# Patient Record
Sex: Male | Born: 1995 | Race: White | Hispanic: No | Marital: Single | Smoking: Never smoker
Health system: Southern US, Community
[De-identification: ages and names within clinical notes are randomized; demographics above are authoritative.]

---
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
## 2015-02-10 ENCOUNTER — Emergency Department (HOSPITAL_COMMUNITY)
Admission: EM | Admit: 2015-02-10 | Discharge: 2015-02-10 | Disposition: A | Discharge status: Home / Self Care | Payer: Self-pay | Attending: Emergency Medicine | Admitting: Emergency Medicine

## 2015-02-10 ENCOUNTER — Emergency Department (HOSPITAL_COMMUNITY): Payer: Self-pay

## 2015-02-10 ENCOUNTER — Encounter (HOSPITAL_COMMUNITY): Payer: Self-pay | Admitting: Emergency Medicine

## 2015-02-10 DIAGNOSIS — R079 Chest pain, unspecified: Secondary | ICD-10-CM | POA: Insufficient documentation

## 2015-02-10 DIAGNOSIS — R0602 Shortness of breath: Secondary | ICD-10-CM | POA: Insufficient documentation

## 2015-02-10 LAB — TROPONIN I

## 2015-02-10 MED ORDER — IBUPROFEN 600 MG PO TABS: 600.0000 mg | ORAL_TABLET | Freq: Four times a day (QID) | ORAL | Status: AC | PRN

## 2015-02-10 NOTE — ED Notes (Signed)
Pt. reports intermittent left chest pain with mild SOB onset this evening , denies cough , no nausea or diaphoresis .

## 2015-02-10 NOTE — ED Notes (Signed)
Patient transported to X-ray 

## 2015-02-10 NOTE — ED Provider Notes (Signed)
CSN: [id number]     Arrival date & time 02/10/15  0050 History   First MD Initiated Contact with Patient 02/10/15 0054     Chief Complaint  Patient presents with  . Chest Pain     (Consider location/radiation/quality/duration/timing/severity/associated sxs/prior Treatment) HPI Comments: 19 year old male presents to the emergency department for evaluation of chest pain. He reports that pain began at 2300 while watching TV. He describes the pain as sharp and nonradiating. It was initially present in his lower midsternal region and migrated slightly left of his sternum. He denies any pain at this time. His pain was initially worse with deep breathing. He reports some mild shortness of breath with his symptoms. No medications taken prior to arrival. He states that symptoms lasted for approximately one hour before spontaneously resolving. Patient reports a history of similar symptoms in the past. He states that it is usually present for a few minutes and then goes away. Patient eyes no recent surgeries, hospitalizations, or travel. He is a social smoker and does not have a history of hypertension or diabetes. No family history of sudden cardiac death. No associated fever, syncope, lightheadedness, dizziness, or leg swelling. No nausea or vomiting.  Patient is a 19 y.o. male presenting with chest pain. The history is provided by the patient. No language interpreter was used.  Chest Pain Associated symptoms: shortness of breath   Associated symptoms: no dizziness, no fever, no nausea and not vomiting     History reviewed. No pertinent past medical history. History reviewed. No pertinent past surgical history. No family history on file. Social History  Substance Use Topics  . Smoking status: Never Smoker   . Smokeless tobacco: None  . Alcohol Use: No    Review of Systems  Constitutional: Negative for fever.  Respiratory: Positive for shortness of breath.   Cardiovascular: Positive for chest  pain.  Gastrointestinal: Negative for nausea and vomiting.  Neurological: Negative for dizziness, syncope and light-headedness.  All other systems reviewed and are negative.   Allergies  Review of patient's allergies indicates no known allergies.  Home Medications   Prior to Admission medications   Medication Sig Start Date End Date Taking? Authorizing Provider  ibuprofen (ADVIL,MOTRIN) 600 MG tablet Take 1 tablet (600 mg total) by mouth every 6 (six) hours as needed. 02/10/15   Antony MaduraKelly Samiksha Pellicano, PA-C   BP 112/74 mmHg  Pulse 71  Temp(Src) 97.4 F (36.3 C) (Oral)  Resp 11  SpO2 97%   Physical Exam  Constitutional: He is oriented to person, place, and time. He appears well-developed and well-nourished. No distress.  Nontoxic/nonseptic appearing  HENT:  Head: Normocephalic and atraumatic.  Eyes: Conjunctivae and EOM are normal. No scleral icterus.  Neck: Normal range of motion.  Cardiovascular: Normal rate, regular rhythm and intact distal pulses.   Pulmonary/Chest: Effort normal and breath sounds normal. No respiratory distress. He has no wheezes. He has no rales.  Abdominal: Soft. He exhibits no distension. There is no tenderness. There is no rebound and no guarding.  Soft, nontender  Musculoskeletal: Normal range of motion.  Neurological: He is alert and oriented to person, place, and time. He exhibits normal muscle tone. Coordination normal.  GCS 15. Speech is goal oriented. Patient moving all extremities.  Skin: Skin is warm and dry. No rash noted. He is not diaphoretic. No erythema. No pallor.  Psychiatric: He has a normal mood and affect. His behavior is normal.  Nursing note and vitals reviewed.   ED Course  Procedures (  including critical care time) Labs Review Labs Reviewed  TROPONIN I    Imaging Review Dg Chest 2 View  02/10/2015  CLINICAL DATA:  Left-sided chest pain tonight EXAM: CHEST  2 VIEW COMPARISON:  None. FINDINGS: Normal heart size and mediastinal  contours. No acute infiltrate or edema. No effusion or pneumothorax. No acute osseous findings. IMPRESSION: Negative chest. Electronically Signed   By: Marnee Spring M.D.   On: 02/10/2015 01:56     I have personally reviewed and evaluated these images and lab results as part of my medical decision-making.   EKG Interpretation   Date/Time:  Tuesday February 10 2015 00:51:16 EST Ventricular Rate:  74 PR Interval:  114 QRS Duration: 92 QT Interval:  366 QTC Calculation: 406 R Axis:   45 Text Interpretation:  Normal sinus rhythm Nonspecific T wave abnormality  Abnormal ECG No old tracing to compare Confirmed by Rhunette Croft, MD, Janey Genta  [phone]) on 02/10/2015 1:47:31 AM      MDM   Final diagnoses:  Chest pain, unspecified chest pain type    19 year old male presents to the emergency department for evaluation of chest pain. He has had similar chest pain in the past which usually resolves after a few minutes. It is pleuritic in nature, worse with deep breathing. Chest x-ray negative for pleural effusion, focal consolidation, or pneumonia. No mediastinal widening to suggest aortic dissection. Patient has a reassuring cardiac workup with a negative troponin and nonischemic EKG. No family history of sudden cardiac death. Heart Score is 1 (FHx of ACS) c/w low risk of acute coronary event. Also doubt PE. Patient is PERC negative.  Patient has been chest pain-free while in the emergency department. Symptoms may be due to indigestion vs PUD vs MSK etiology. Higher suspicion for MSK such as costochondritis given pleuritic nature of pain. Do not believe there is indication for further emergent workup at this time. Will discharge with prescription for NSAIDs and instructions for primary care follow-up. Return precautions given at discharge. Patient discharged in good condition with no unaddressed concerns; VSS.   Filed Vitals:   02/10/15 0215 02/10/15 0230 02/10/15 0245 02/10/15 0300  BP: 106/53 103/60  113/71 112/74  Pulse: 67 68 70 71  Temp:      TempSrc:      Resp: SpO2: 97% 97% 97% 97%     Antony Madura, PA-C 02/10/15 0458  Derwood Kaplan, MD 02/10/15 [phone]

## 2015-02-10 NOTE — Discharge Instructions (Signed)
Nonspecific Chest Pain  °Chest pain can be caused by many different conditions. There is always a chance that your pain could be related to something serious, such as a heart attack or a blood clot in your lungs. Chest pain can also be caused by conditions that are not life-threatening. If you have chest pain, it is very important to follow up with your health care provider. °CAUSES  °Chest pain can be caused by: °· Heartburn. °· Pneumonia or bronchitis. °· Anxiety or stress. °· Inflammation around your heart (pericarditis) or lung (pleuritis or pleurisy). °· A blood clot in your lung. °· A collapsed lung (pneumothorax). It can develop suddenly on its own (spontaneous pneumothorax) or from trauma to the chest. °· Shingles infection (varicella-zoster virus). °· Heart attack. °· Damage to the bones, muscles, and cartilage that make up your chest wall. This can include: °¨ Bruised bones due to injury. °¨ Strained muscles or cartilage due to frequent or repeated coughing or overwork. °¨ Fracture to one or more ribs. °¨ Sore cartilage due to inflammation (costochondritis). °RISK FACTORS  °Risk factors for chest pain may include: °· Activities that increase your risk for trauma or injury to your chest. °· Respiratory infections or conditions that cause frequent coughing. °· Medical conditions or overeating that can cause heartburn. °· Heart disease or family history of heart disease. °· Conditions or health behaviors that increase your risk of developing a blood clot. °· Having had chicken pox (varicella zoster). °SIGNS AND SYMPTOMS °Chest pain can feel like: °· Burning or tingling on the surface of your chest or deep in your chest. °· Crushing, pressure, aching, or squeezing pain. °· Dull or sharp pain that is worse when you move, cough, or take a deep breath. °· Pain that is also felt in your back, neck, shoulder, or arm, or pain that spreads to any of these areas. °Your chest pain may come and go, or it may stay  constant. °DIAGNOSIS °Lab tests or other studies may be needed to find the cause of your pain. Your health care provider may have you take a test called an ambulatory ECG (electrocardiogram). An ECG records your heartbeat patterns at the time the test is performed. You may also have other tests, such as: °· Transthoracic echocardiogram (TTE). During echocardiography, sound waves are used to create a picture of all of the heart structures and to look at how blood flows through your heart. °· Transesophageal echocardiogram (TEE). This is a more advanced imaging test that obtains images from inside your body. It allows your health care provider to see your heart in finer detail. °· Cardiac monitoring. This allows your health care provider to monitor your heart rate and rhythm in real time. °· Holter monitor. This is a portable device that records your heartbeat and can help to diagnose abnormal heartbeats. It allows your health care provider to track your heart activity for several days, if needed. °· Stress tests. These can be done through exercise or by taking medicine that makes your heart beat more quickly. °· Blood tests. °· Imaging tests. °TREATMENT  °Your treatment depends on what is causing your chest pain. Treatment may include: °· Medicines. These may include: °¨ Acid blockers for heartburn. °¨ Anti-inflammatory medicine. °¨ Pain medicine for inflammatory conditions. °¨ Antibiotic medicine, if an infection is present. °¨ Medicines to dissolve blood clots. °¨ Medicines to treat coronary artery disease. °· Supportive care for conditions that do not require medicines. This may include: °¨ Resting. °¨ Applying heat   or cold packs to injured areas. °¨ Limiting activities until pain decreases. °HOME CARE INSTRUCTIONS °· If you were prescribed an antibiotic medicine, finish it all even if you start to feel better. °· Avoid any activities that bring on chest pain. °· Do not use any tobacco products, including  cigarettes, chewing tobacco, or electronic cigarettes. If you need help quitting, ask your health care provider. °· Do not drink alcohol. °· Take medicines only as directed by your health care provider. °· Keep all follow-up visits as directed by your health care provider. This is important. This includes any further testing if your chest pain does not go away. °· If heartburn is the cause for your chest pain, you may be told to keep your head raised (elevated) while sleeping. This reduces the chance that acid will go from your stomach into your esophagus. °· Make lifestyle changes as directed by your health care provider. These may include: °¨ Getting regular exercise. Ask your health care provider to suggest some activities that are safe for you. °¨ Eating a heart-healthy diet. A registered dietitian can help you to learn healthy eating options. °¨ Maintaining a healthy weight. °¨ Managing diabetes, if necessary. °¨ Reducing stress. °SEEK MEDICAL CARE IF: °· Your chest pain does not go away after treatment. °· You have a rash with blisters on your chest. °· You have a fever. °SEEK IMMEDIATE MEDICAL CARE IF:  °· Your chest pain is worse. °· You have an increasing cough, or you cough up blood. °· You have severe abdominal pain. °· You have severe weakness. °· You faint. °· You have chills. °· You have sudden, unexplained chest discomfort. °· You have sudden, unexplained discomfort in your arms, back, neck, or jaw. °· You have shortness of breath at any time. °· You suddenly start to sweat, or your skin gets clammy. °· You feel nauseous or you vomit. °· You suddenly feel light-headed or dizzy. °· Your heart begins to beat quickly, or it feels like it is skipping beats. °These symptoms may represent a serious problem that is an emergency. Do not wait to see if the symptoms will go away. Get medical help right away. Call your local emergency services (911 in the [place] Do not drive yourself to the hospital. °  °This  information is not intended to replace advice given to you by your health care provider. Make sure you discuss any questions you have with your health care provider. °  °Document Released: 11/17/2004 Document Revised: 02/28/2014 Document Reviewed: 09/13/2013 °[employer] Interactive Patient Education ©2016 [employer] ° °

## 2017-01-14 IMAGING — CR DG CHEST 2V
2 series · 2 of 2 positions shown · non-contrast
Comparison: None.

CLINICAL DATA: Left-sided chest pain tonight

EXAM:
CHEST  2 VIEW

[chest pa]
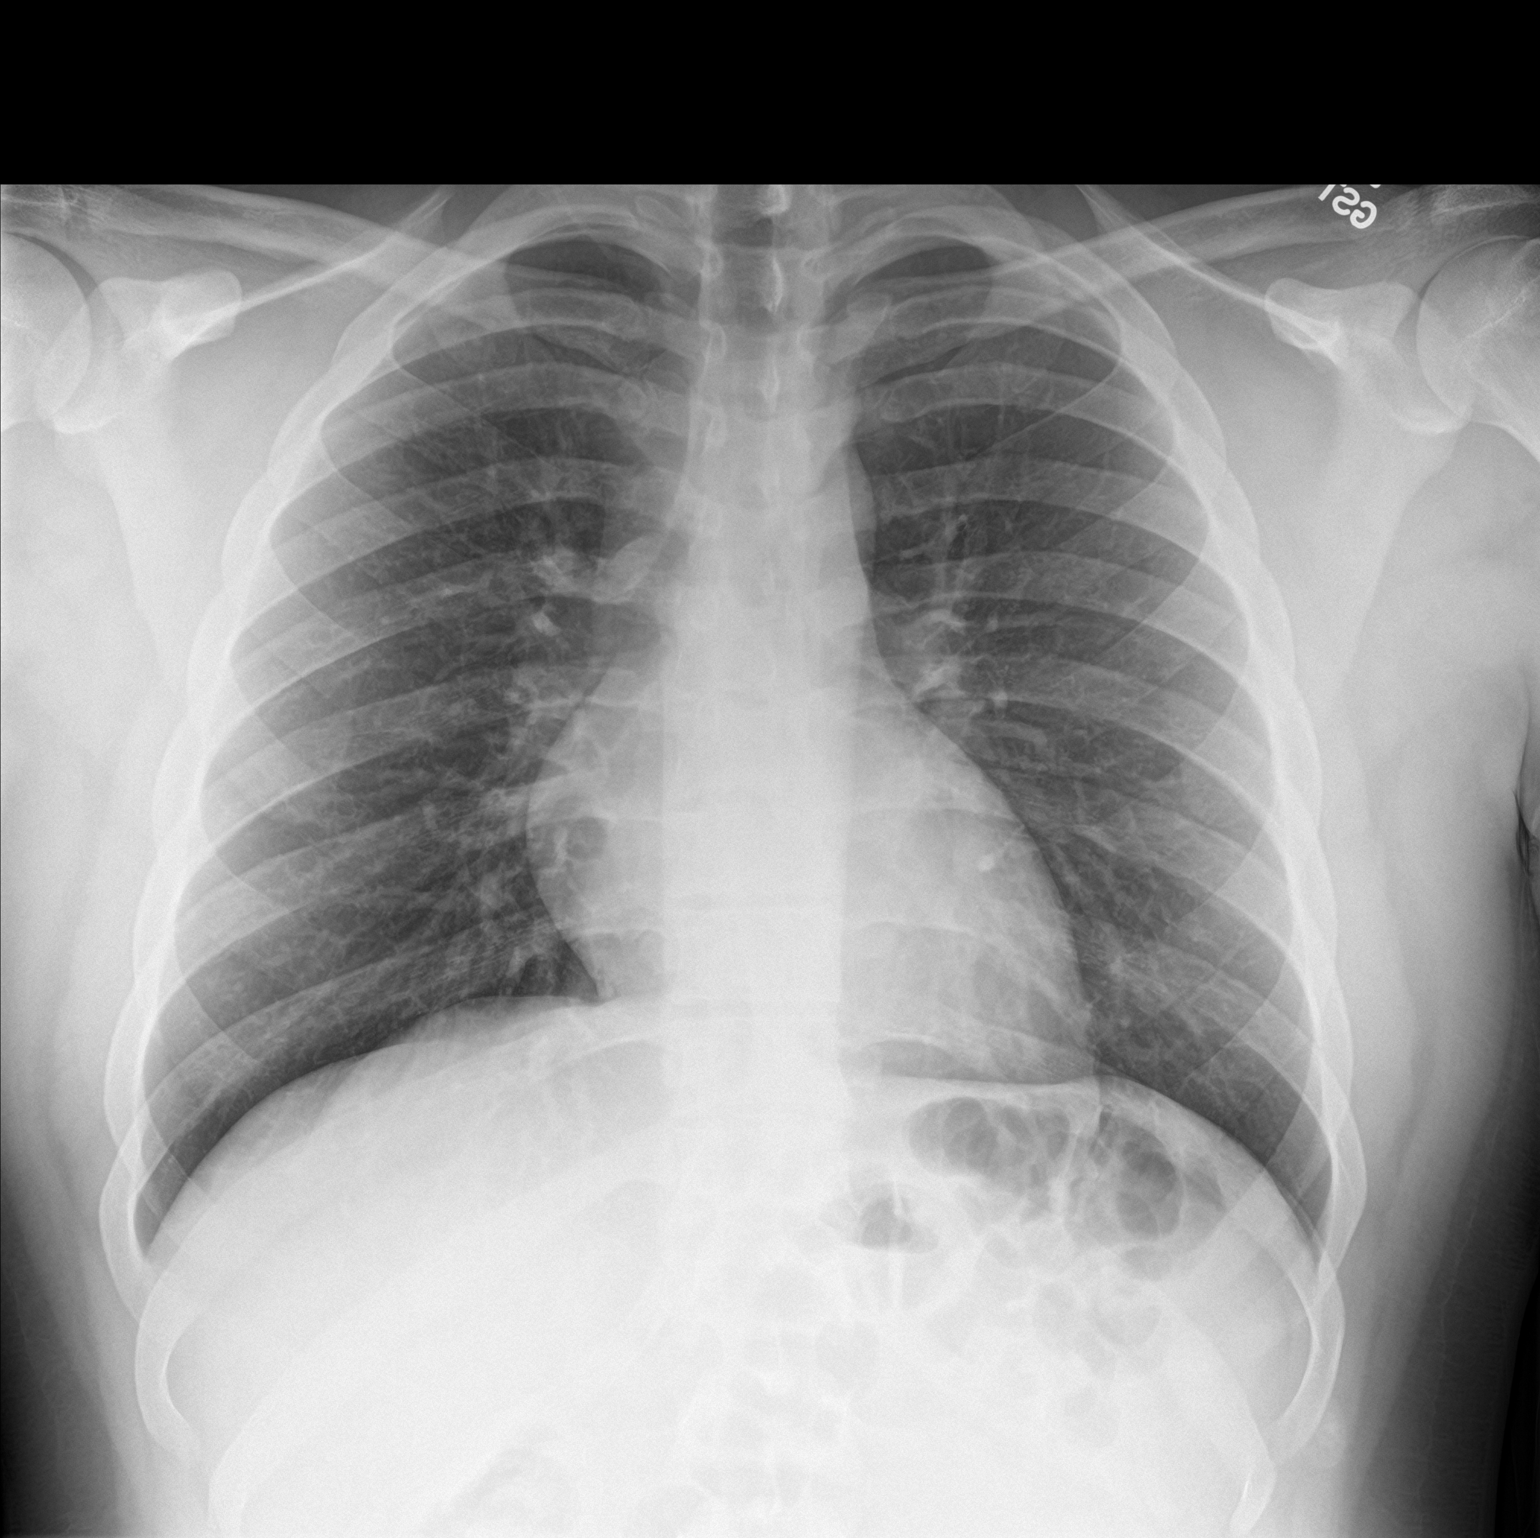

[chest lat]
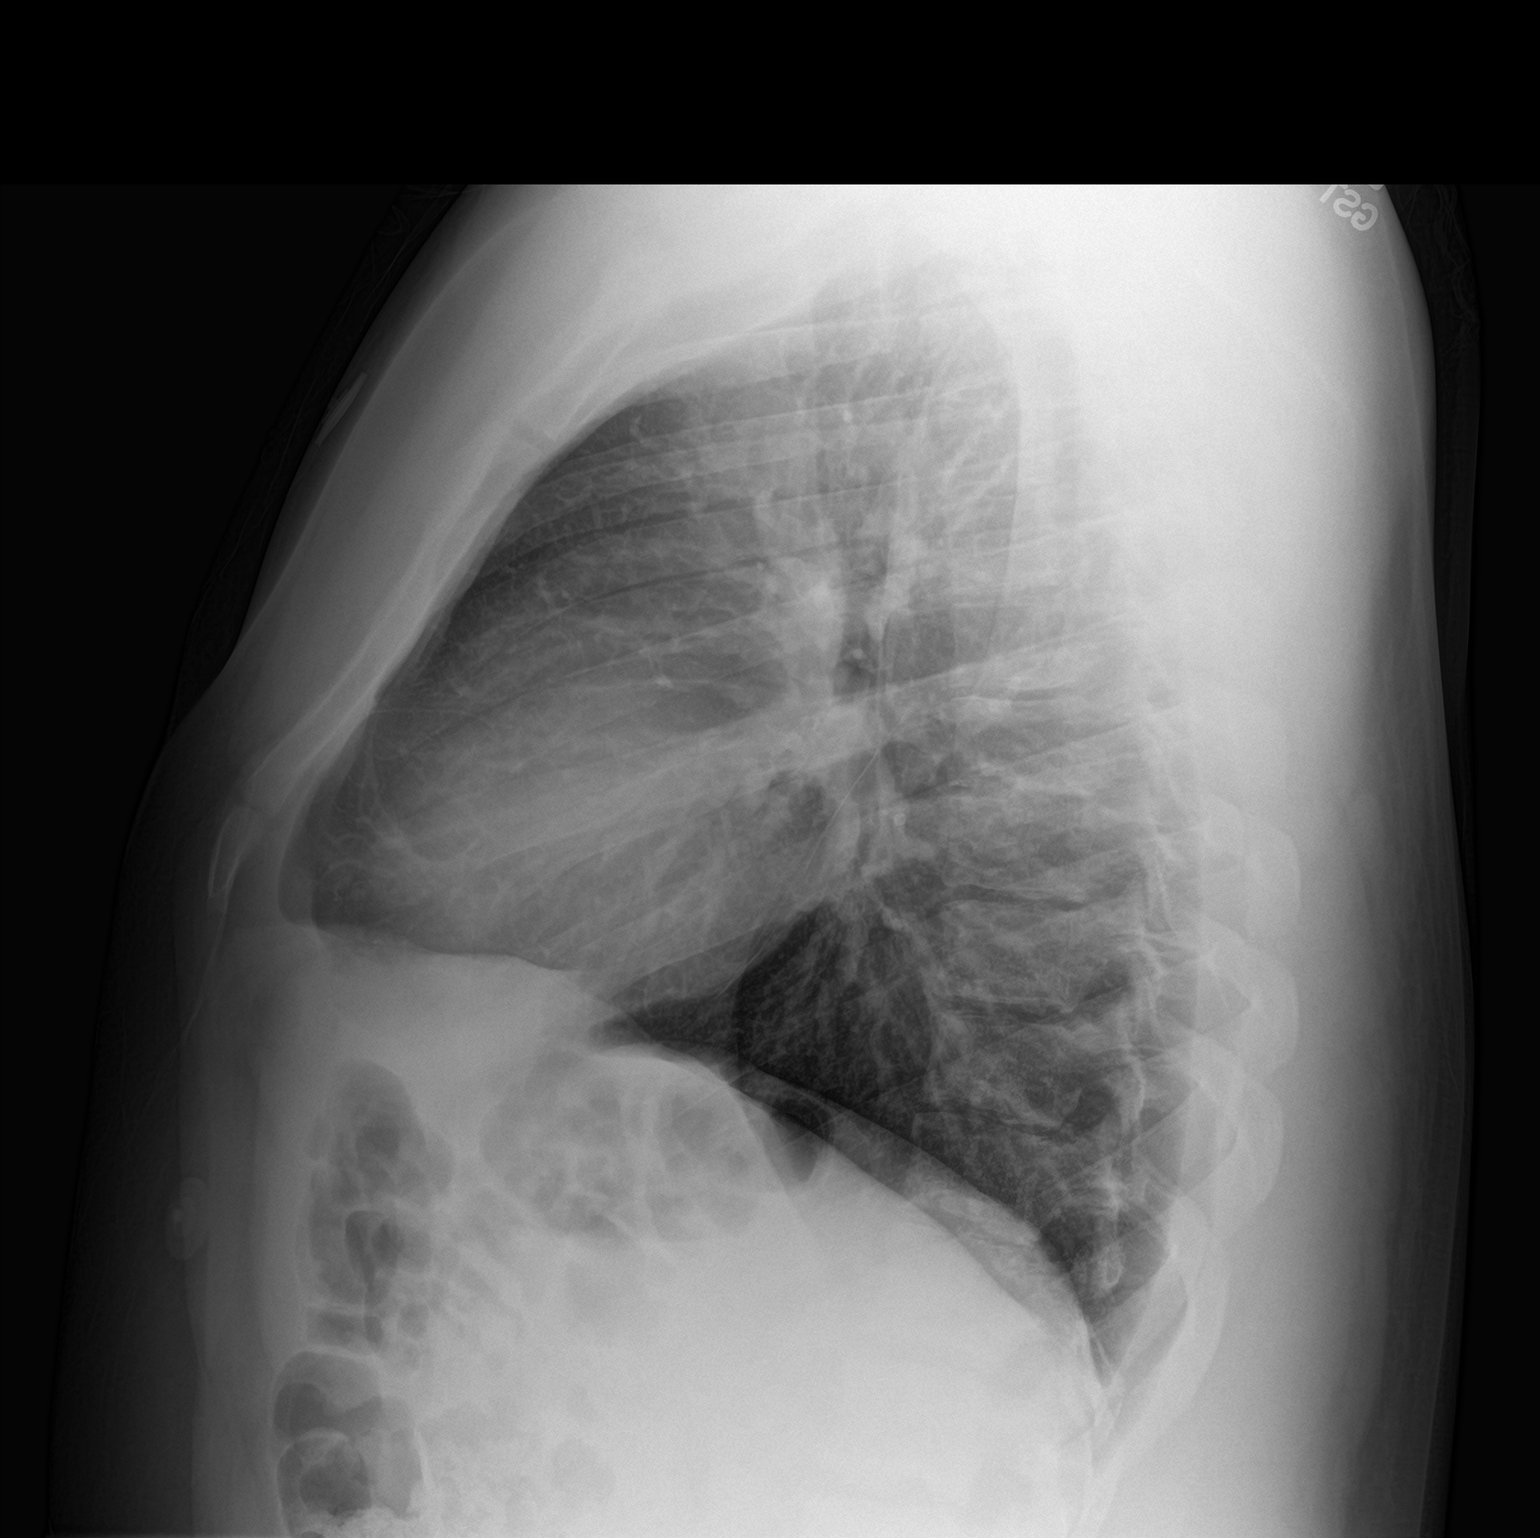

[2 of 2 positions shown; findings below may reference images not displayed]

FINDINGS: Normal heart size and mediastinal contours. No acute infiltrate or
edema. No effusion or pneumothorax. No acute osseous findings.
IMPRESSION: Negative chest.
